# Patient Record
Sex: Male | Born: 2000 | Race: Black or African American | Hispanic: No | Marital: Single | Smoking: Never smoker
Health system: Southern US, Community
[De-identification: ages and names within clinical notes are randomized; demographics above are authoritative.]

## PROBLEM LIST (undated history)

## (undated) DIAGNOSIS — J302 Other seasonal allergic rhinitis: Secondary | ICD-10-CM

---
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
## 2020-10-02 ENCOUNTER — Ambulatory Visit
Admission: EM | Admit: 2020-10-02 | Discharge: 2020-10-02 | Disposition: A | Discharge status: Home / Self Care | Payer: Medicaid Other | Attending: Family Medicine | Admitting: Family Medicine

## 2020-10-02 DIAGNOSIS — Z20822 Contact with and (suspected) exposure to covid-19: Secondary | ICD-10-CM | POA: Diagnosis not present

## 2020-10-02 NOTE — ED Triage Notes (Signed)
Needs covid test

## 2020-10-03 NOTE — ED Provider Notes (Signed)
Here for COVID test   Eustace Moore, MD 10/03/20 1137

## 2020-10-04 LAB — NOVEL CORONAVIRUS, NAA: SARS-CoV-2, NAA: DETECTED — AB

## 2020-10-04 LAB — SARS-COV-2, NAA 2 DAY TAT

## 2022-01-07 ENCOUNTER — Emergency Department (HOSPITAL_COMMUNITY)
Admission: EM | Admit: 2022-01-07 | Discharge: 2022-01-07 | Disposition: A | Discharge status: Home / Self Care | Payer: Commercial Managed Care - HMO | Attending: Emergency Medicine | Admitting: Emergency Medicine

## 2022-01-07 ENCOUNTER — Other Ambulatory Visit: Payer: Self-pay

## 2022-01-07 ENCOUNTER — Encounter (HOSPITAL_COMMUNITY): Payer: Self-pay | Admitting: Emergency Medicine

## 2022-01-07 DIAGNOSIS — Z202 Contact with and (suspected) exposure to infections with a predominantly sexual mode of transmission: Secondary | ICD-10-CM | POA: Diagnosis present

## 2022-01-07 LAB — URINALYSIS, ROUTINE W REFLEX MICROSCOPIC
Bilirubin Urine: NEGATIVE
Glucose, UA: NEGATIVE mg/dL
Hgb urine dipstick: NEGATIVE
Ketones, ur: NEGATIVE mg/dL
Nitrite: NEGATIVE
Protein, ur: NEGATIVE mg/dL
Specific Gravity, Urine: 1.024 (ref 1.005–1.030)
pH: 6 (ref 5.0–8.0)

## 2022-01-07 LAB — RPR: RPR Ser Ql: NONREACTIVE

## 2022-01-07 LAB — RAPID HIV SCREEN (HIV 1/2 AB+AG)
HIV 1/2 Antibodies: NONREACTIVE
HIV-1 P24 Antigen - HIV24: NONREACTIVE

## 2022-01-07 NOTE — ED Triage Notes (Addendum)
Pt wants to be checked for stds. Pt states that one girl told him that she had chlamydia. Denies symptoms  ?

## 2022-01-07 NOTE — ED Provider Notes (Signed)
?[facility] ?Provider Note ? ?CSN: [id number] ?Arrival date & time: 01/07/22 0310 ? ?Chief Complaint(s) ?Exposure to STD ? ?HPI ?Tanner Barron is a 21 y.o. male who presents for reported STD exposure.  He reports that he last had intercourse about 2 months ago with a male that called him approximately 4 to 6 weeks ago and told him that she had chlamydia.  When asked if he had any symptoms patient reported "yeah but not really."  He endorsed mild dysuria but not currently.  He denied any discharge.  No scrotal tenderness or pain.  No other physical complaints. ? ?The history is provided by the patient.  ? ?Past Medical History ?Past Medical History:  ?Diagnosis Date  ? Asthma   ? Seasonal allergies   ? ?There are no problems to display for this patient. ? ?Home Medication(s) ?Prior to Admission medications   ?Medication Sig Start Date End Date Taking? Authorizing Provider  ?albuterol (PROVENTIL HFA;VENTOLIN HFA) 108 (90 BASE) MCG/ACT inhaler Inhale 2 puffs into the lungs every 6 (six) hours as needed for wheezing or shortness of breath.     [provider]  ?hydrocortisone 2.5 % lotion Apply topically 2 (two) times daily. 06/26/17   Viviano Simasobinson, Lauren, NP  ?hydrOXYzine (ATARAX/VISTARIL) 25 MG tablet Take 1 tablet (25 mg total) by mouth every 8 (eight) hours as needed for itching. 06/26/17   Viviano Simasobinson, Lauren, NP  ?ibuprofen (ADVIL,MOTRIN) 600 MG tablet Take 1 tablet (600 mg total) by mouth every 6 (six) hours as needed. 06/13/17   Elpidio AnisUpstill, Shari, PA-C  ?                                                                                                                                  ?Allergies ?Patient has no known allergies. ? ?Review of Systems ?Review of Systems ?As noted in HPI ? ?Physical Exam ?Vital Signs  ?I have reviewed the triage vital signs ?BP (!) 138/95 (BP Location: Right Arm)   Pulse 86   Temp 98.3 ?F (36.8 ?C) (Oral)   Resp 17   Ht 5\' 8"  (1.727 m)   Wt 75 kg    SpO2 98%   BMI 25.14 kg/m?  ? ?Physical Exam ?Vitals reviewed.  ?Constitutional:   ?   General: He is not in acute distress. ?   Appearance: He is well-developed. He is not diaphoretic.  ?HENT:  ?   Head: Normocephalic and atraumatic.  ?   Right Ear: External ear normal.  ?   Left Ear: External ear normal.  ?   Nose: Nose normal.  ?   Mouth/Throat:  ?   Mouth: Mucous membranes are moist.  ?Eyes:  ?   General: No scleral icterus. ?   Conjunctiva/sclera: Conjunctivae normal.  ?Neck:  ?   Trachea: Phonation normal.  ?Cardiovascular:  ?   Rate and Rhythm: Normal rate and regular rhythm.  ?Pulmonary:  ?  Effort: Pulmonary effort is normal. No respiratory distress.  ?   Breath sounds: No stridor.  ?Abdominal:  ?   General: There is no distension.  ?Genitourinary: ?   Penis: Circumcised. No erythema, tenderness or discharge.   ?   Testes: Normal.  ?Musculoskeletal:     ?   General: Normal range of motion.  ?   Cervical back: Normal range of motion.  ?Neurological:  ?   Mental Status: He is alert and oriented to person, place, and time.  ?Psychiatric:     ?   Behavior: Behavior normal.  ? ? ?ED Results and Treatments ?Labs ?(all labs ordered are listed, but only abnormal results are displayed) ?Labs Reviewed  ?URINALYSIS, ROUTINE W REFLEX MICROSCOPIC - Abnormal; Notable for the following components:  ?    Result Value  ? Leukocytes,Ua TRACE (*)   ? Bacteria, UA FEW (*)   ? All other components within normal limits  ?RAPID HIV SCREEN (HIV 1/2 AB+AG)  ?RPR  ?GC/CHLAMYDIA PROBE AMP ([place]) NOT AT [facility]  ?GC/CHLAMYDIA PROBE AMP ([place]) NOT AT [facility]  ?                                                                                                                       ?EKG ? EKG Interpretation ? ?Date/Time:    ?Ventricular Rate:    ?PR Interval:    ?QRS Duration:   ?QT Interval:    ?QTC Calculation:   ?R Axis:     ?Text Interpretation:   ?  ? ?  ? ?Radiology ?No results found. ? ?Pertinent labs & imaging results  that were available during my care of the patient were reviewed by me and considered in my medical decision making (see MDM for details). ? ?Medications Ordered in ED ?Medications - No data to display                                                               ?                                                                    ?Procedures ?Procedures ? ?(including critical care time) ? ?Medical Decision Making / ED Course ? ? ? Complexity of Problem: ? ? ? ?Patient's presenting problem/concern, DDX, and MDM listed below: ?Patient presented for possible STD exposure 2 months ago. ?No symptoms. ?Exam without evidence of STI urethritis. ? ? ? ?  Complexity of Data: ?  ? ?Laboratory Tests ordered listed below with my independent  interpretation: ?UA with mild leukocytes. ?Not overtly concerning for infection ?GC chlamydia sent ?HIV and RPR sent ?  ?ED Course:   ? ?Assessment, Add'l Intervention, and Reassessment: ?Exposure to STD ?No exam findings concerning for urethritis ?We will hold on empiric antibiotics at this time.  Will wait for GC/chlamydia and call back if needed.  ? ? ?Final Clinical Impression(s) / ED Diagnoses ?Final diagnoses:  ?STD exposure  ? ?The patient appears reasonably screened and/or stabilized for discharge and I doubt any other medical condition or other [facility] requiring further screening, evaluation, or treatment in the ED at this time prior to discharge. Safe for discharge with strict return precautions. ? ?Disposition: Discharge ? ?Condition: Good ? ?I have discussed the results, Dx and Tx plan with the patient/family who expressed understanding and agree(s) with the plan. Discharge instructions discussed at length. The patient/family was given strict return precautions who verbalized understanding of the instructions. No further questions at time of discharge.  ? ? ?ED Discharge Orders   ? ? None  ? ?  ? ? ? ?Follow Up: ?Department, [facility] ?[address] ?[place]  [address] ?[phone] ? ?Go to  ?as needed ? ? ? ?  ? ? ? ? ? ?This chart was dictated using voice recognition software.  Despite best efforts to proofread,  errors can occur which can change the documentation meaning. ? ?  ?Nira Conn, MD ?01/07/22 [phone] ? ?

## 2022-01-08 LAB — GC/CHLAMYDIA PROBE AMP (~~LOC~~) NOT AT ARMC
Chlamydia: POSITIVE — AB
Comment: NEGATIVE
Comment: NORMAL
Neisseria Gonorrhea: NEGATIVE

## 2022-01-11 ENCOUNTER — Telehealth (HOSPITAL_COMMUNITY): Payer: Self-pay

## 2022-03-06 ENCOUNTER — Ambulatory Visit (HOSPITAL_COMMUNITY)
Admission: AD | Admit: 2022-03-06 | Discharge: 2022-03-06 | Disposition: A | Discharge status: Home / Self Care | Payer: Medicaid Other | Attending: Psychiatry | Admitting: Psychiatry

## 2022-03-06 NOTE — H&P (Signed)
Behavioral Health Medical Screening Exam  HPI: Tanner Barron is a 21 y.o. A A male who presents voluntarily accompanied by the aunt to [facility] for drug induced rambling and incoherent speech. Patient is homeless for the past 6 months. Patient has past medical history of asthma and seasonal allergies and past psychiatric of homicidal ideation.  On assessment today, patient is examined in the screening room sitting on the bench. Chart is reviewed and findings shared with the tx team and discussed with Dr. Dwyane Dee. Alert and oriented to person, time and not situation. Speech is pressured and nonsensical. Eye contact fleeting and appears agitated. Able to carryout conversation but speech os loquacious. Mood anxious, depressed and dysphoric. Affect blunt and full range. Memory fair, judgement fair and insight lacking.  Reported that he was in prison in 2020 for "dropping the girl." When asked what that means stated, "I mean the cops thought I was selling marijuana."  Report he want help to buy a car, have his own place and find a good place to work. When asked about mental health illness stated, "I do not have any mental illness." Patient reported that she knows how to steal a car without buying 1.  Informed patient that was not a proper way of dealing with life. Patient reported that he did not need any more help . Patient denied SI, HI, and AVH.  He also denies paranoia and delusional He requested some resources to help him with housing.    Disposition: Based on my evaluation the patient does not appear to have an emergency psychiatric condition.Community resources provided and patient left [facility] without any incidents.                                                                     Total Time spent with patient: 1 hour  Psychiatric Specialty Exam:  Presentation  General Appearance: Disheveled; Appropriate for Environment; Casual  Eye Contact:Fleeting  Speech:Clear and Coherent; Pressured  Speech  Volume:Normal  Handedness:Right  Mood and Affect  Mood:Anxious; Depressed; Dysphoric  Affect:Blunt; Full Range  Thought Process  Thought Processes:Coherent; Disorganized  Descriptions of Associations:Loose  Orientation:Full (Time, Place and Person)  Thought Content:Illogical; Tangential  History of Schizophrenia/Schizoaffective disorder:No data recorded Duration of Psychotic Symptoms:No data recorded Hallucinations:Hallucinations: None  Ideas of Reference:None  Suicidal Thoughts:Suicidal Thoughts: No  Homicidal Thoughts:Homicidal Thoughts: No  Sensorium  Memory:Immediate Fair; Recent Fair; Remote Fair  Judgment:Poor  Insight:Lacking  Executive Functions  Concentration:Fair  Attention Span:Fair  Tanner Barron  Psychomotor Activity  Psychomotor Activity:Psychomotor Activity: Normal  Assets  Assets:Communication Skills; Physical Health; Social Support  Sleep  Sleep:Sleep: Good Number of Hours of Sleep: 6  Physical Exam: Physical Exam Nursing note reviewed.  HENT:     Head: Normocephalic and atraumatic.     Right Ear: External ear normal.     Left Ear: External ear normal.     Nose: Nose normal.     Mouth/Throat:     Mouth: Mucous membranes are moist.     Pharynx: Oropharynx is clear.  Eyes:     Extraocular Movements: Extraocular movements intact.     Conjunctiva/sclera: Conjunctivae normal.     Pupils: Pupils are equal, round, and reactive to light.  Cardiovascular:  Rate and Rhythm: Normal rate.     Pulses: Normal pulses.     Comments: BP 146/95 Pulmonary:     Effort: Pulmonary effort is normal.  Abdominal:     Palpations: Abdomen is soft.  Genitourinary:    Comments: Deferred Musculoskeletal:        General: Normal range of motion.     Cervical back: Normal range of motion.  Skin:    General: Skin is warm.  Neurological:     General: No focal deficit present.     Mental Status: He is alert  and oriented to person, place, and time.  Psychiatric:     Comments: Restless and rambling speech   Review of Systems  Constitutional: Negative.  Negative for chills and fever.  HENT: Negative.  Negative for hearing loss and tinnitus.   Eyes: Negative.  Negative for blurred vision and double vision.  Respiratory: Negative.  Negative for cough, sputum production, shortness of breath and wheezing.   Cardiovascular: Negative.  Negative for chest pain and palpitations.       BP 146/95  Gastrointestinal: Negative.  Negative for abdominal pain, constipation, diarrhea, heartburn, nausea and vomiting.  Genitourinary: Negative.  Negative for dysuria, frequency and urgency.  Musculoskeletal: Negative.  Negative for back pain, falls, joint pain, myalgias and neck pain.  Skin: Negative.  Negative for itching and rash.  Neurological: Negative.  Negative for dizziness, tingling, tremors, sensory change, speech change, focal weakness, seizures, loss of consciousness, weakness and headaches.  Endo/Heme/Allergies: Negative.  Negative for environmental allergies and polydipsia. Does not bruise/bleed easily.  Psychiatric/Behavioral:  Positive for substance abuse. The patient is nervous/anxious.   Blood pressure (!) 146/95, pulse 86, temperature 98.4 F (36.9 C), temperature source Oral, resp. rate 18, SpO2 99 %. There is no height or weight on file to calculate BMI.  Musculoskeletal: Strength & Muscle Tone: within normal limits Gait & Station: normal Patient leans: N/A  Recommendations:  Based on my evaluation the patient does not appear to have an emergency psychiatric condition.  Laretta Bolster, FNP 03/06/2022, 7:34 PM

## 2022-03-06 NOTE — BH Assessment (Signed)
Comprehensive Clinical Assessment (CCA) Note  03/06/2022 Tanner JakobEric Barron [id number]   Disposition: Per Alan Mulderina Ntuen, NP, patient does not meet criteria for inpatient psychiatric treatment. Patient recommended to follow up with outpatient referrals provided for outpatient therapist and psychiatrist. [facility], [facility], and [facility] [place].   Flowsheet Row [facility]  C-SSRS RISK CATEGORY No Risk      Chief Complaint:  Chief Complaint  Patient presents with   Psychiatric Evaluation   Visit Diagnosis: Major Depressive Disorder, Recurrent, severe with psychotic features and Cannabis Use Disorder   Tanner Barron is a 21 y/o male that [facility]. He is accompanied by his aunt Twanna Hy(Kay Mc Gray). 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 States that he has been homeless for several months. Denies current SI. No hx of suicide attempts and/or gestures. Denies self injurious behaviors. Denies depressive symptoms and anxiety. Family history (mother) of Schizophrenia and Bipolar Disoder. Denies HI. Denies hx of aggressive and/or assaultive behaviors. Denies legal issues and pending court dates. Initially, denies that he is on probation/parole. Later recants during the assessment stating that he is currently on probation. Denies AVH's. However, patient does appear paranoid with tangential and delusional thoughts. Currently, using THC daily and according to patient, "all day every day". Average amount of use is 2 grams. Last use was 1 week ago.  He reports one prior psychiatric admission during his middle school years after making suicidal comments. Denies that he has an out patient therapist/psychiatrist. Also, verbalizes that he doesn't need any outpatient psychiatric services.  CCA Screening, Triage and  Referral (STR)  Patient Reported Information How did you hear about us? No data recorded What Is the Reason for Your Visit/Call Today? Tanner Barron is a 21 y/o male that [facility]. He is accompanied by his aunt Twanna Hy(Kay Mc Gray). 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 States that he has been homeless for several months. Denies current SI. No hx of suicide attempts and/or gestures. Denies self injurious behaviors. Denies depressive symptoms and anxiety. Family history (mother) of Schizophrenia and Bipolar Disoder. Denies HI. Denies hx of aggressive and/or assaultive behaviors. Denies legal issues and pending court dates. Initially, denies that he is on probation/parole. Later recants during the assessment stating that he is currently on probation. Denies AVH's. However, patient does appear paranoid with tangential and delusional thoughts. Currently, using THC daily and according to patient, "all day every day". Average amount of use is 2 grams. Last use was 1 week ago.  He reports one prior psychiatric admission during his middle school years after making suicidal comments. Denies that he has an out patient therapist/psychiatrist. Also, verbalizes that he doesn't need any outpatient psychiatric services.  How Long Has This Been Causing You Problems? > than 6 months  What Do You Feel Would Help You the Most Today? Treatment for Depression or other mood problem; Alcohol or Drug Use Treatment; Medication(s)   Have You Recently Had Any Thoughts About Hurting Yourself? No  Are You Planning to Commit Suicide/Harm Yourself At This time? No   Have you Recently Had Thoughts About Hurting Someone Karolee Ohslse? No  Are You Planning to Harm Someone at This Time? No  Explanation: No data recorded  Have You Used Any Alcohol or Drugs in the Past 24 Hours? No  How Long Ago Did You Use Drugs or Alcohol? No data recorded What Did You  Use and How Much? No data recorded  Do You Currently Have a Therapist/Psychiatrist? No  Name of Therapist/Psychiatrist: No data recorded  Have You Been Recently Discharged From Any Office Practice or Programs? No  Explanation of Discharge From Practice/Program: No data recorded    CCA Screening Triage Referral Assessment Type of Contact: Tele-Assessment  Telemedicine Service Delivery: Telemedicine service delivery: This service was provided via telemedicine using a 2-way, interactive audio and video technology  Is this Initial or Reassessment? Initial Assessment  Date Telepsych consult ordered in [facility]:  03/06/22  Time Telepsych consult ordered in [facility]:  No data recorded Location of Assessment: [facility] ED  Provider Location: Other (comment) ([facility])   Collateral Involvement: Debe Coder (Uncle)   Does Patient Have a Automotive engineer Guardian? No data recorded Name and Contact of Legal Guardian: No data recorded If Minor and Not Living with Parent(s), Who has Custody? No data recorded Is CPS involved or ever been involved? Never  Is APS involved or ever been involved? Never   Patient Determined To Be At Risk for Harm To Self or Others Based on Review of Patient Reported Information or Presenting Complaint? No  Method: No data recorded Availability of Means: No data recorded Intent: No data recorded Notification Required: No data recorded Additional Information for Danger to Others Potential: No data recorded Additional Comments for Danger to Others Potential: No data recorded Are There Guns or Other Weapons in Your Home? No data recorded Types of Guns/Weapons: No data recorded Are These Weapons Safely Secured?                            No data recorded Who Could Verify You Are Able To Have These Secured: No data recorded Do You Have any Outstanding Charges, Pending Court Dates, Parole/Probation? No data recorded Contacted To Inform of Risk of Harm To Self or Others: No  data recorded   Does Patient Present under Involuntary Commitment? No  IVC Papers Initial File Date: No data recorded  [place] of Residence: [place]   Patient Currently Receiving the Following Services: Medication Management; Individual Therapy   Determination of Need: Emergent (2 hours)   Options For Referral: Medication Management; Inpatient Hospitalization     CCA Biopsychosocial Patient Reported Schizophrenia/Schizoaffective Diagnosis in Past: No   Strengths: No data recorded  Mental Health Symptoms Depression:   Irritability; Change in energy/activity; Difficulty Concentrating; Fatigue; Hopelessness   Duration of Depressive symptoms:  Duration of Depressive Symptoms: Greater than two weeks   Mania:   Change in energy/activity; Increased Energy; Irritability   Anxiety:    Tension; Sleep   Psychosis:   None   Duration of Psychotic symptoms:    Trauma:   None   Obsessions:   None   Compulsions:   None   Inattention:   None   Hyperactivity/Impulsivity:   None   Oppositional/Defiant Behaviors:   None   Emotional Irregularity:   None   Other Mood/Personality Symptoms:   Patient is calm and cooperative.    Mental Status Exam Appearance and self-care  Stature:   Average   Weight:   Average weight   Clothing:   Disheveled   Grooming:   Neglected   Cosmetic use:   None   Posture/gait:   Normal  Motor activity:   Not Remarkable   Sensorium  Attention:   Normal   Concentration:   Normal   Orientation:   Time; Situation; Place; Person; Object   Recall/memory:   Normal   Affect and Mood  Affect:   Depressed   Mood:  No data recorded  Relating  Eye contact:   None   Facial expression:   Depressed   Attitude toward examiner:   Guarded; Cooperative   Thought and Language  Speech flow:  Normal   Thought content:   Appropriate to Mood and Circumstances   Preoccupation:   Ruminations   Hallucinations:    Auditory   Organization:  No data recorded  [employer] of Knowledge:   Average   Intelligence:   Average   Abstraction:   Normal   Judgement:   Normal   Reality Testing:   Adequate   Insight:   Gaps   Decision Making:   Normal   Social Functioning  Social Maturity:   Responsible   Social Judgement:   Normal   Stress  Stressors:   Family conflict   Coping Ability:   Normal   Skill Deficits:   Communication   Supports:   Family     Religion: Religion/Spirituality Are You A Religious Person?: No  Leisure/Recreation: Leisure / Recreation Do You Have Hobbies?: No  Exercise/Diet: Exercise/Diet Do You Exercise?: No Have You Gained or Lost A Significant Amount of Weight in the Past Six Months?: No Do You Follow a Special Diet?: No Do You Have Any Trouble Sleeping?: No   CCA Employment/Education Employment/Work Situation: Employment / Work Situation Employment Situation: Employed Patient's Job has Been Impacted by Current Illness: No Has Patient ever Been in Equities trader?: No  Education: Education Is Patient Currently Attending School?: No Did Theme park manager?: No Did You Have An Individualized Education Program (IIEP): No Did You Have Any Difficulty At [employer]?: No Patient's Education Has Been Impacted by Current Illness: No   CCA Family/Childhood History Family and Relationship History: Family history Marital status: Single Does patient have children?: No  Childhood History:  Childhood History By whom was/is the patient raised?: Both parents Did patient suffer any verbal/emotional/physical/sexual abuse as a child?: No Did patient suffer from severe childhood neglect?: No Has patient ever been sexually abused/assaulted/raped as an adolescent or adult?: No Was the patient ever a victim of a crime or a disaster?: No Witnessed domestic violence?: No Has patient been affected by domestic violence as an adult?:  No  Child/Adolescent Assessment:     CCA Substance Use Alcohol/Drug Use: Alcohol / Drug Use Pain Medications: none Prescriptions: see med list Over the Counter: see med list History of alcohol / drug use?: Yes Withdrawal Symptoms: None Substance #1 Name of Substance 1: THC 1 - Age of First Use: "I was in middle school" 1 - Amount (size/oz): 2 grams 1 - Frequency: daily 1 - Duration: on-going 1 - Last Use / Amount: 1 week ago 1- Route of Use: inhalation                       ASAM's:  Six Dimensions of Multidimensional Assessment  Dimension 1:  Acute Intoxication and/or Withdrawal Potential:      Dimension 2:  Biomedical Conditions and Complications:      Dimension 3:  Emotional, Behavioral, or Cognitive Conditions and Complications:     Dimension 4:  Readiness to Change:     Dimension 5:  Relapse, Continued use, or Continued Problem Potential:     Dimension 6:  Recovery/Living Environment:     ASAM Severity Score:    ASAM Recommended Level of Treatment:     Substance use Disorder (SUD) Substance Use Disorder (SUD)  Checklist Symptoms of Substance Use: Continued use despite having a persistent/recurrent physical/psychological problem caused/exacerbated by use, Continued use despite persistent or recurrent social, interpersonal problems, caused or exacerbated by use, Evidence of tolerance, Evidence of withdrawal (Comment), Large amounts of time spent to obtain, use or recover from the substance(s), Persistent desire or unsuccessful efforts to cut down or control use, Presence of craving or strong urge to use, Recurrent use that results in a failure to fulfill major role obligations (work, school, home), Repeated use in physically hazardous situations, Social, occupational, recreational activities given up or reduced due to use, Substance(s) often taken in larger amounts or over longer times than was intended  Recommendations for  Services/Supports/Treatments: Recommendations for Services/Supports/Treatments Recommendations For Services/Supports/Treatments: Medication Management, Inpatient Hospitalization  Discharge Disposition:    DSM5 Diagnoses: There are no problems to display for this patient.    Referrals to Alternative Service(s): Referred to Alternative Service(s):   Place:   Date:   Time:    Referred to Alternative Service(s):   Place:   Date:   Time:    Referred to Alternative Service(s):   Place:   Date:   Time:    Referred to Alternative Service(s):   Place:   Date:   Time:     Melynda Ripple, Counselor

## 2022-10-19 ENCOUNTER — Emergency Department (HOSPITAL_COMMUNITY)
Admission: EM | Admit: 2022-10-19 | Discharge: 2022-10-20 | Disposition: A | Discharge status: Home / Self Care | Payer: Commercial Managed Care - HMO | Attending: Emergency Medicine | Admitting: Emergency Medicine

## 2022-10-19 ENCOUNTER — Other Ambulatory Visit: Payer: Self-pay

## 2022-10-19 DIAGNOSIS — R300 Dysuria: Secondary | ICD-10-CM | POA: Insufficient documentation

## 2022-10-19 DIAGNOSIS — Z5321 Procedure and treatment not carried out due to patient leaving prior to being seen by health care provider: Secondary | ICD-10-CM | POA: Insufficient documentation

## 2022-10-19 LAB — URINALYSIS, ROUTINE W REFLEX MICROSCOPIC
Bilirubin Urine: NEGATIVE
Glucose, UA: NEGATIVE mg/dL
Hgb urine dipstick: NEGATIVE
Ketones, ur: NEGATIVE mg/dL
Leukocytes,Ua: NEGATIVE
Nitrite: NEGATIVE
Protein, ur: NEGATIVE mg/dL
Specific Gravity, Urine: 1.021 (ref 1.005–1.030)
pH: 6 (ref 5.0–8.0)

## 2022-10-19 LAB — HIV ANTIBODY (ROUTINE TESTING W REFLEX): HIV Screen 4th Generation wRfx: NONREACTIVE

## 2022-10-19 NOTE — ED Provider Triage Note (Signed)
Emergency Medicine Provider Triage Evaluation Note  Tanner Barron , a 22 y.o. male  was evaluated in triage.  Pt complains of dysuria.  Patient reports feelings of dysuria for the past 2-3 days.  Reports some darker colored urine and concerned about potential blood in urine.  Asking to be tested for all STDs.  Denies fever, chills, abdominal pain, nausea, vomiting, change in bowel habits, testicular pain, penile discharge, rash  Review of Systems  Positive: See above Negative:   Physical Exam  BP (!) 129/91   Pulse 63   Temp 98.4 F (36.9 C) (Oral)   Resp 16   SpO2 100%  Gen:   Awake, no distress   Resp:  Normal effort  MSK:   Moves extremities without difficulty  Other:    Medical Decision Making  Medically screening exam initiated at 8:17 PM.  Appropriate orders placed.  Farrel Gobble was informed that the remainder of the evaluation will be completed by another provider, this initial triage assessment does not replace that evaluation, and the importance of remaining in the ED until their evaluation is complete.     Wilnette Kales, [place] 10/19/22 2018

## 2022-10-19 NOTE — ED Triage Notes (Signed)
Patient reports dysuria " burning" sensation today , denies hematuria or fever .

## 2022-10-20 LAB — RPR: RPR Ser Ql: NONREACTIVE

## 2022-10-20 NOTE — ED Notes (Signed)
NA x3 vitals 

## 2022-10-21 LAB — URINE CULTURE: Culture: NO GROWTH

## 2023-08-04 ENCOUNTER — Encounter (HOSPITAL_COMMUNITY): Payer: Self-pay

## 2023-08-04 ENCOUNTER — Emergency Department (HOSPITAL_COMMUNITY)
Admission: EM | Admit: 2023-08-04 | Discharge: 2023-08-04 | Disposition: A | Discharge status: Home / Self Care | Payer: Medicaid Other | Attending: Emergency Medicine | Admitting: Emergency Medicine

## 2023-08-04 ENCOUNTER — Other Ambulatory Visit: Payer: Self-pay

## 2023-08-04 ENCOUNTER — Emergency Department (HOSPITAL_COMMUNITY): Payer: Medicaid Other

## 2023-08-04 DIAGNOSIS — S9031XA Contusion of right foot, initial encounter: Secondary | ICD-10-CM | POA: Diagnosis not present

## 2023-08-04 DIAGNOSIS — S93401A Sprain of unspecified ligament of right ankle, initial encounter: Secondary | ICD-10-CM | POA: Insufficient documentation

## 2023-08-04 DIAGNOSIS — R369 Urethral discharge, unspecified: Secondary | ICD-10-CM

## 2023-08-04 DIAGNOSIS — M25571 Pain in right ankle and joints of right foot: Secondary | ICD-10-CM | POA: Diagnosis present

## 2023-08-04 LAB — URINALYSIS, ROUTINE W REFLEX MICROSCOPIC
Bilirubin Urine: NEGATIVE
Glucose, UA: NEGATIVE mg/dL
Hgb urine dipstick: NEGATIVE
Ketones, ur: NEGATIVE mg/dL
Leukocytes,Ua: NEGATIVE
Nitrite: NEGATIVE
Protein, ur: NEGATIVE mg/dL
Specific Gravity, Urine: 1.019 (ref 1.005–1.030)
pH: 6 (ref 5.0–8.0)

## 2023-08-04 MED ORDER — LIDOCAINE HCL (PF) 1 % IJ SOLN
1.0000 mL | Freq: Once | INTRAMUSCULAR | Status: AC
  Administered 2023-08-04: 1 mL
  Filled 2023-08-04: qty 5

## 2023-08-04 MED ORDER — DOXYCYCLINE HYCLATE 100 MG PO CAPS: 100.0000 mg | ORAL_CAPSULE | Freq: Two times a day (BID) | ORAL | 0 refills | Status: AC

## 2023-08-04 MED ORDER — ACETAMINOPHEN 325 MG PO TABS
650.0000 mg | ORAL_TABLET | Freq: Once | ORAL | Status: AC
  Administered 2023-08-04: 650 mg via ORAL
  Filled 2023-08-04: qty 2

## 2023-08-04 MED ORDER — CEFTRIAXONE SODIUM 500 MG IJ SOLR
500.0000 mg | Freq: Once | INTRAMUSCULAR | Status: AC
  Administered 2023-08-04: 500 mg via INTRAMUSCULAR
  Filled 2023-08-04: qty 500

## 2023-08-04 NOTE — ED Triage Notes (Signed)
Pt arrived POV c/o right ankle pain x 3-5 days and also wanting to get tested for STDs.
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
## 2023-08-04 NOTE — Discharge Instructions (Addendum)
Your gonorrhea and chlamydia results will not be back for the next 5 days.  If they are positive you will be called.  We are prophylactically pretreating you, as you are having symptoms.  I have sent some antibiotics to the pharmacy please take them in completion, do not have sex with your previous partners until they are treated, or if you test negative for STDs.  If you test positive for STDs please inform your partners

## 2023-08-05 LAB — GC/CHLAMYDIA PROBE AMP (~~LOC~~) NOT AT ARMC
Chlamydia: NEGATIVE
Comment: NEGATIVE
Comment: NORMAL
Neisseria Gonorrhea: NEGATIVE
